# Patient Record
Sex: Male | Born: 1985 | Race: White | Hispanic: No | Marital: Single | Smoking: Current some day smoker
Health system: Southern US, Community
[De-identification: ages and names within clinical notes are randomized; demographics above are authoritative.]

## PROBLEM LIST (undated history)

## (undated) DIAGNOSIS — F329 Major depressive disorder, single episode, unspecified: Secondary | ICD-10-CM

## (undated) DIAGNOSIS — F1910 Other psychoactive substance abuse, uncomplicated: Secondary | ICD-10-CM

## (undated) DIAGNOSIS — F319 Bipolar disorder, unspecified: Secondary | ICD-10-CM

## (undated) DIAGNOSIS — F32A Depression, unspecified: Secondary | ICD-10-CM

## (undated) HISTORY — PX: ANKLE SURGERY: SHX546

## (undated) HISTORY — PX: HAND SURGERY: SHX662

---
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
## 2008-07-15 ENCOUNTER — Emergency Department (HOSPITAL_COMMUNITY): Admission: EM | Admit: 2008-07-15 | Discharge: 2008-07-15 | Payer: Self-pay | Admitting: Emergency Medicine

## 2008-07-17 ENCOUNTER — Ambulatory Visit: Payer: Self-pay | Admitting: Dentistry

## 2008-07-17 ENCOUNTER — Encounter: Admission: AD | Admit: 2008-07-17 | Discharge: 2008-07-17 | Payer: Self-pay | Admitting: Dentistry

## 2009-10-02 ENCOUNTER — Emergency Department (HOSPITAL_COMMUNITY): Admission: EM | Admit: 2009-10-02 | Discharge: 2009-10-02 | Payer: Self-pay | Admitting: Emergency Medicine

## 2009-11-05 ENCOUNTER — Emergency Department (HOSPITAL_COMMUNITY): Admission: EM | Admit: 2009-11-05 | Discharge: 2009-11-05 | Payer: Self-pay | Admitting: Emergency Medicine

## 2009-11-08 ENCOUNTER — Emergency Department (HOSPITAL_COMMUNITY): Admission: EM | Admit: 2009-11-08 | Discharge: 2009-11-08 | Payer: Self-pay | Admitting: Emergency Medicine

## 2010-01-08 ENCOUNTER — Emergency Department (HOSPITAL_COMMUNITY): Admission: EM | Admit: 2010-01-08 | Discharge: 2010-01-08 | Payer: Self-pay | Admitting: Emergency Medicine

## 2012-12-05 DIAGNOSIS — M79609 Pain in unspecified limb: Secondary | ICD-10-CM

## 2013-05-08 ENCOUNTER — Emergency Department (HOSPITAL_COMMUNITY)
Admission: EM | Admit: 2013-05-08 | Discharge: 2013-05-09 | Disposition: A | Discharge status: Home / Self Care | Payer: Self-pay | Attending: Emergency Medicine | Admitting: Emergency Medicine

## 2013-05-08 ENCOUNTER — Emergency Department (HOSPITAL_COMMUNITY): Payer: Self-pay

## 2013-05-08 ENCOUNTER — Encounter (HOSPITAL_COMMUNITY): Payer: Self-pay | Admitting: Emergency Medicine

## 2013-05-08 DIAGNOSIS — R4182 Altered mental status, unspecified: Secondary | ICD-10-CM | POA: Insufficient documentation

## 2013-05-08 DIAGNOSIS — R51 Headache: Secondary | ICD-10-CM | POA: Insufficient documentation

## 2013-05-08 DIAGNOSIS — F1910 Other psychoactive substance abuse, uncomplicated: Secondary | ICD-10-CM | POA: Insufficient documentation

## 2013-05-08 DIAGNOSIS — F17200 Nicotine dependence, unspecified, uncomplicated: Secondary | ICD-10-CM | POA: Insufficient documentation

## 2013-05-08 HISTORY — DX: Other psychoactive substance abuse, uncomplicated: F19.10

## 2013-05-08 MED ORDER — LORAZEPAM 2 MG/ML IJ SOLN
INTRAMUSCULAR | Status: AC
  Administered 2013-05-08: 1 mg via INTRAVENOUS
  Filled 2013-05-08: qty 1

## 2013-05-08 MED ORDER — LORAZEPAM 2 MG/ML IJ SOLN
1.0000 mg | Freq: Once | INTRAMUSCULAR | Status: AC
  Administered 2013-05-08: 1 mg via INTRAVENOUS

## 2013-05-08 NOTE — ED Notes (Signed)
NBOME is a psychedelic drug that is similar to LSD according to the patient, the patient snorted the powder, unknown amount, pt is paranoid, fearful of dying.

## 2013-05-08 NOTE — ED Notes (Signed)
Patient states he snorted an N-bomb an hour ago and now c/o head and face pressure.  Patient states he has a history of substance abuse and was taking drug to get high.

## 2013-05-08 NOTE — ED Provider Notes (Signed)
History  This chart was scribed for [employer]. Rubin Payor, MD by Bennett Scrape, ED Scribe. This patient was seen in room APA07/APA07 and the patient's care was started at 9:52 PM.  CSN: [id number]  Arrival date & time 05/08/13  2121   First MD Initiated Contact with Patient 05/08/13 2152      Chief Complaint  Patient presents with  . Headache   Level 5 Caveat-AMS due to illegal substance  The history is provided by the patient. No language interpreter was used.    HPI Comments: Daniel Rose is a 27 y.o. male who presents to the Emergency Department complaining of right-side HA that radiates to the left occipital area described as a "cramp" that started less than an hour after snorting an "N-bomb". He states that he is tripping currently but denies any hallucinations. He reports that he has snorted an "N-bomb" twice before and states that he "tripped on it a little". He is unsure of the dosage. Pt keeps repeating that he has a brain aneurysm and doesn't want to die. Due to his AMS, he is unable to answer any further questions.   Past Medical History  Diagnosis Date  . Substance abuse     Past Surgical History  Procedure Laterality Date  . Ankle surgery      No family history on file.  History  Substance Use Topics  . Smoking status: Current Some Day Smoker  . Smokeless tobacco: Not on file  . Alcohol Use: No      Review of Systems  Unable to perform ROS: Mental status change    Allergies  Review of patient's allergies indicates no known allergies.  Home Medications  No current outpatient prescriptions on file.  Triage Vitals: BP 182/92  Pulse 113  Temp(Src) 98.5 F (36.9 C) (Oral)  Resp 22  Ht 5\' 9"  (1.753 m)  Wt 175 lb (79.379 kg)  BMI 25.83 kg/m2  SpO2 97%  Physical Exam  Nursing note and vitals reviewed. Constitutional: He appears well-developed and well-nourished. No distress.  HENT:  Head: Normocephalic and atraumatic.  Mouth/Throat:  Oropharynx is clear and moist.  Eyes: Conjunctivae and EOM are normal.  Enlarged pupils bilaterally, minimally reactive  Neck: Neck supple. No tracheal deviation present.  Cardiovascular: Regular rhythm.   Mild tachycardia   Pulmonary/Chest: Effort normal and breath sounds normal. No respiratory distress.  Abdominal: Soft. There is no tenderness.  Musculoskeletal: Normal range of motion.  Neurological: He is alert.  Awake and alert but paranoid   Skin: Skin is warm and dry.  Psychiatric: He has a normal mood and affect. His behavior is normal.    ED Course  Procedures (including critical care time)  Medications  LORazepam (ATIVAN) injection 1 mg (1 mg Intravenous Given 05/08/13 2206)    DIAGNOSTIC STUDIES: Oxygen Saturation is 97% on room air, normal by my interpretation.    COORDINATION OF CARE: 9:55 PM-Discussed treatment plan which includes CT of head with pt at bedside and pt agreed to plan.   Ct Head Wo Contrast  05/08/2013   *RADIOLOGY REPORT*  Clinical Data: Headache.  CT HEAD WITHOUT CONTRAST  Technique:  Contiguous axial images were obtained from the base of the skull through the vertex without contrast.  Comparison: No priors.  Findings: No acute intracranial abnormalities.  Specifically, no evidence of acute intracranial hemorrhage, no definite findings of acute/subacute cerebral ischemia, no mass, mass effect, hydrocephalus or abnormal intra or extra-axial fluid collections. Visualized paranasal sinuses and mastoids are  well pneumatized.  No acute displaced skull fractures are identified.  IMPRESSION: 1.  No acute intracranial abnormalities. 2.  The appearance of the brain is normal.   Original Report Authenticated By: Trudie Reed, M.D.     1. Substance abuse   2. Headache       MDM  Patient with headache after snorting "N-bomb"negative head CT. Will monitor for side effects and likely discharge   I personally performed the services described in this  documentation, which was scribed in my presence. The recorded information has been reviewed and is accurate.        Juliet Rude. Rubin Payor, MD 05/08/13 2317

## 2013-05-09 NOTE — ED Provider Notes (Signed)
0001 Patient is walking around the ER department. Reviewed CT results with the patient. His brother is here and will be with him.Discharged home.  Nicoletta Dress. Colon Branch, MD 05/09/13 1610

## 2014-02-15 ENCOUNTER — Encounter (HOSPITAL_COMMUNITY): Payer: Self-pay | Admitting: Emergency Medicine

## 2014-02-15 ENCOUNTER — Emergency Department (HOSPITAL_COMMUNITY): Payer: Self-pay

## 2014-02-15 ENCOUNTER — Emergency Department (HOSPITAL_COMMUNITY)
Admission: EM | Admit: 2014-02-15 | Discharge: 2014-02-15 | Disposition: A | Discharge status: Home / Self Care | Payer: Self-pay | Attending: Emergency Medicine | Admitting: Emergency Medicine

## 2014-02-15 DIAGNOSIS — S60011A Contusion of right thumb without damage to nail, initial encounter: Secondary | ICD-10-CM

## 2014-02-15 DIAGNOSIS — S6000XA Contusion of unspecified finger without damage to nail, initial encounter: Secondary | ICD-10-CM | POA: Insufficient documentation

## 2014-02-15 DIAGNOSIS — Z79899 Other long term (current) drug therapy: Secondary | ICD-10-CM | POA: Insufficient documentation

## 2014-02-15 DIAGNOSIS — F17200 Nicotine dependence, unspecified, uncomplicated: Secondary | ICD-10-CM | POA: Insufficient documentation

## 2014-02-15 DIAGNOSIS — Y9389 Activity, other specified: Secondary | ICD-10-CM | POA: Insufficient documentation

## 2014-02-15 DIAGNOSIS — S93409A Sprain of unspecified ligament of unspecified ankle, initial encounter: Secondary | ICD-10-CM | POA: Insufficient documentation

## 2014-02-15 DIAGNOSIS — R296 Repeated falls: Secondary | ICD-10-CM | POA: Insufficient documentation

## 2014-02-15 DIAGNOSIS — Y929 Unspecified place or not applicable: Secondary | ICD-10-CM | POA: Insufficient documentation

## 2014-02-15 MED ORDER — HYDROCODONE-ACETAMINOPHEN 5-325 MG PO TABS: ORAL_TABLET | ORAL | Status: DC

## 2014-02-15 MED ORDER — IBUPROFEN 800 MG PO TABS: 800.0000 mg | ORAL_TABLET | Freq: Three times a day (TID) | ORAL | Status: DC

## 2014-02-15 MED ORDER — HYDROCODONE-ACETAMINOPHEN 5-325 MG PO TABS
1.0000 | ORAL_TABLET | Freq: Once | ORAL | Status: AC
  Administered 2014-02-15: 1 via ORAL
  Filled 2014-02-15: qty 1

## 2014-02-15 NOTE — ED Notes (Signed)
Pt states he fell Wednesday due to boots that he was wearing. Swelling to right lateral ankle with bruising and swelling to right hand also. Pt states he put his hand down to break his fall.

## 2014-02-15 NOTE — Discharge Instructions (Signed)
Ankle Sprain An ankle sprain is an injury to the strong, fibrous tissues (ligaments) that hold your ankle bones together.  HOME CARE   Put ice on your ankle for 1 2 days or as told by your doctor.  Put ice in a plastic bag.  Place a towel between your skin and the bag.  Leave the ice on for 15-20 minutes at a time, every 2 hours while you are awake.  Only take medicine as told by your doctor.  Raise (elevate) your injured ankle above the level of your heart as much as possible for 2 3 days.  Use crutches if your doctor tells you to. Slowly put your own weight on the affected ankle. Use the crutches until you can walk without pain.  If you have a plaster splint:  Do not rest it on anything harder than a pillow for 24 hours.  Do not put weight on it.  Do not get it wet.  Take it off to shower or bathe.  If given, use an elastic wrap or support stocking for support. Take the wrap off if your toes lose feeling (numb), tingle, or turn cold or blue.  If you have an air splint:  Add or let out air to make it comfortable.  Take it off at night and to shower and bathe.  Wiggle your toes and move your ankle up and down often while you are wearing it. GET HELP RIGHT AWAY IF:   Your toes lose feeling (numb) or turn blue.  You have severe pain that is increasing.  You have rapidly increasing bruising or puffiness (swelling).  Your toes feel very cold.  You lose feeling in your foot.  Your medicine does not help your pain. MAKE SURE YOU:   Understand these instructions.  Will watch your condition.  Will get help right away if you are not doing well or get worse. Document Released: 04/26/2008 Document Revised: 08/02/2012 Document Reviewed: 05/22/2012 [facility]ExitCare Patient Information 2014 [place]ExitCare, [place].  Joint Sprain A sprain is a tear or stretch in the ligaments that hold a joint together. Severe sprains may need as long as 3-6 weeks of immobilization and/or exercises to  heal completely. Sprained joints should be rested and protected. If not, they can become unstable and prone to re-injury. Proper treatment can reduce your pain, shorten the period of disability, and reduce the risk of repeated injuries. TREATMENT   Rest and elevate the injured joint to reduce pain and swelling.  Apply ice packs to the injury for 20-30 minutes every 2-3 hours for the next 2-3 days.  Keep the injury wrapped in a compression bandage or splint as long as the joint is painful or as instructed by your caregiver.  Do not use the injured joint until it is completely healed to prevent re-injury and chronic instability. Follow the instructions of your caregiver.  Long-term sprain management may require exercises and/or treatment by a physical therapist. Taping or special braces may help stabilize the joint until it is completely better. SEEK MEDICAL CARE IF:   You develop increased pain or swelling of the joint.  You develop increasing redness and warmth of the joint.  You develop a fever.  It becomes stiff.  Your hand or foot gets cold or numb. Document Released: 12/16/2004 Document Revised: 01/31/2012 Document Reviewed: 11/25/2008 [facility]ExitCare Patient Information 2014 [place]ExitCare, [place]LLC.

## 2014-02-17 NOTE — ED Provider Notes (Signed)
CSN: [id number]     Arrival date & time 02/15/14  1222 History   First MD Initiated Contact with Patient 02/15/14 1254     Chief Complaint  Patient presents with  . Fall     (Consider location/radiation/quality/duration/timing/severity/associated sxs/prior Treatment) Patient is a [age] y.o. male presenting with ankle pain. The history is provided by the patient.  Ankle Pain Location:  Ankle Time since incident:  2 days Injury: yes   Mechanism of injury: fall   Mechanism of injury comment:  Twisting injury Fall:    Fall occurred:  Tripped and standing   Impact surface:  Hard floor   Point of impact:  Outstretched arms   Entrapped after fall: no   Ankle location:  R ankle Pain details:    Quality:  Aching and throbbing   Radiates to:  Does not radiate   Severity:  Moderate   Onset quality:  Sudden   Duration:  2 days   Timing:  Constant   Progression:  Unchanged Chronicity:  New Dislocation: no   Prior injury to area:  No Relieved by:  Nothing Worsened by:  Bearing weight, activity and rotation Ineffective treatments:  NSAIDs Associated symptoms: swelling   Associated symptoms: no back pain, no decreased ROM, no fever, no neck pain, no numbness and no tingling    Patient also c/o bruising to the right thumb with swelling, but denies pain to the thumb or decreased range of motion.     Past Medical History  Diagnosis Date  . Substance abuse    Past Surgical History  Procedure Laterality Date  . Ankle surgery     No family history on file. History  Substance Use Topics  . Smoking status: Current Some Day Smoker    Types: Cigarettes  . Smokeless tobacco: Not on file  . Alcohol Use: No    Review of Systems  Constitutional: Negative for fever and chills.  Genitourinary: Negative for dysuria and difficulty urinating.  Musculoskeletal: Positive for arthralgias and joint swelling. Negative for back pain and neck pain.  Skin: Negative for color change and wound.  All  other systems reviewed and are negative.      Allergies  Review of patient's allergies indicates no known allergies.  Home Medications   Current Outpatient Rx  Name  Route  Sig  Dispense  Refill  . aspirin EC 81 MG tablet   Oral   Take 81-243 mg by mouth daily as needed for moderate pain.         Marland Kitchen. HYDROcodone-acetaminophen (NORCO/VICODIN) 5-325 MG per tablet      Take one-two tabs po q 4-6 hrs prn pain   20 tablet   0   . ibuprofen (ADVIL,MOTRIN) 800 MG tablet   Oral   Take 1 tablet (800 mg total) by mouth 3 (three) times daily.   21 tablet   0    BP 136/76  Pulse 89  Temp(Src) 98.8 F (37.1 C) (Oral)  Resp 16  Ht 5\' 8"  (1.727 m)  Wt 180 lb (81.647 kg)  BMI 27.38 kg/m2  SpO2 99% Physical Exam  Nursing note and vitals reviewed. Constitutional: He is oriented to person, place, and time. He appears well-developed and well-nourished. No distress.  HENT:  Head: Normocephalic and atraumatic.  Cardiovascular: Normal rate, regular rhythm, normal heart sounds and intact distal pulses.   No murmur heard. Pulmonary/Chest: Effort normal and breath sounds normal. No respiratory distress.  Musculoskeletal: He exhibits edema and tenderness.  Right ankle: He exhibits decreased range of motion, swelling and ecchymosis. He exhibits no deformity, no laceration and normal pulse. Tenderness. Lateral malleolus and medial malleolus tenderness found. No posterior TFL, no head of 5th metatarsal and no proximal fibula tenderness found. Achilles tendon normal.       Right hand: He exhibits tenderness and swelling. He exhibits normal range of motion, no bony tenderness, normal two-point discrimination, normal capillary refill, no deformity and no laceration. Normal sensation noted. Normal strength noted. He exhibits no finger abduction and no thumb/finger opposition.       Hands: ttp of the lateral and medial right ankle, moderate STS.    DP pulse is brisk,distal sensation intact.   Mild  To moderate bruising.  No erythema, abrasion, or bony deformity.  No proximal tenderness. Compartments soft  Neurological: He is alert and oriented to person, place, and time. He exhibits normal muscle tone. Coordination normal.  Skin: Skin is warm and dry.    ED Course  Procedures (including critical care time) Labs Review Labs Reviewed - No data to display Imaging Review Dg Ankle Complete Right  02/15/2014   CLINICAL DATA:  FALL  EXAM: RIGHT ANKLE - COMPLETE 3+ VIEW  COMPARISON:  DG ANKLE COMPLETE*R* dated 01/08/2010  FINDINGS: There is no evidence of fracture, dislocation, or joint effusion. There is a wall corticated ossific fragment distal to the medial malleolus likely representing sequela of prior trauma. There is an old fracture of the dorsal proximal aspect of the navicular. There is no evidence of arthropathy or other focal bone abnormality. Severe soft tissue swelling over the lateral malleolus.  IMPRESSION: Severe soft tissue swelling over the lateral malleolus without a discrete tear.   Electronically Signed   By: Elige Ko   On: 02/15/2014 13:43   Dg Hand Complete Right  02/15/2014   CLINICAL DATA:  Pain and bruising at the base of right thumb  EXAM: RIGHT HAND - COMPLETE 3+ VIEW  COMPARISON:  None.  FINDINGS: Three views of the right hand submitted. No acute fracture or subluxation. No radiopaque foreign body.  IMPRESSION: Negative.   Electronically Signed   By: Natasha Mead M.D.   On: 02/15/2014 13:42    EKG Interpretation None      MDM   Final diagnoses:  Ankle sprain  Contusion of thumb, right    ASO splint applied, thumb spica also applied.  Crutches given.  Pain improved, remains NV intact.  No concerning sx's for compartment syndrome.  No tenderness proximal to the ankle.  Bruising and STS of the proximal thumb.  Sx's likely related to sprain of ankle and thumb.    Pt agrees to RICE therapy and close f/u with orthopedics.  Pt appears stable for  d/c    Britley Gashi L. Trisha Mangle, PA-C 02/17/14 2138

## 2014-02-19 NOTE — ED Provider Notes (Signed)
Medical screening examination/treatment/procedure(s) were performed by non-physician practitioner and as supervising physician I was immediately available for consultation/collaboration.   EKG Interpretation None        Astin Sayre M Frannie Shedrick, DO 02/19/14 1040 

## 2015-02-12 ENCOUNTER — Emergency Department (HOSPITAL_COMMUNITY)
Admission: EM | Admit: 2015-02-12 | Discharge: 2015-02-12 | Disposition: A | Discharge status: Home / Self Care | Payer: Self-pay | Attending: Emergency Medicine | Admitting: Emergency Medicine

## 2015-02-12 ENCOUNTER — Encounter (HOSPITAL_COMMUNITY): Payer: Self-pay | Admitting: *Deleted

## 2015-02-12 DIAGNOSIS — Z7982 Long term (current) use of aspirin: Secondary | ICD-10-CM | POA: Insufficient documentation

## 2015-02-12 DIAGNOSIS — J069 Acute upper respiratory infection, unspecified: Secondary | ICD-10-CM | POA: Insufficient documentation

## 2015-02-12 DIAGNOSIS — Z720 Tobacco use: Secondary | ICD-10-CM | POA: Insufficient documentation

## 2015-02-12 DIAGNOSIS — Z79899 Other long term (current) drug therapy: Secondary | ICD-10-CM | POA: Insufficient documentation

## 2015-02-12 DIAGNOSIS — Z791 Long term (current) use of non-steroidal anti-inflammatories (NSAID): Secondary | ICD-10-CM | POA: Insufficient documentation

## 2015-02-12 NOTE — Discharge Instructions (Signed)
Upper Respiratory Infection, Adult An upper respiratory infection (URI) is also sometimes known as the common cold. The upper respiratory tract includes the nose, sinuses, throat, trachea, and bronchi. Bronchi are the airways leading to the lungs. Most people improve within 1 week, but symptoms can last up to 2 weeks. A residual cough may last even longer.  CAUSES Many different viruses can infect the tissues lining the upper respiratory tract. The tissues become irritated and inflamed and often become very moist. Mucus production is also common. A cold is contagious. You can easily spread the virus to others by oral contact. This includes kissing, sharing a glass, coughing, or sneezing. Touching your mouth or nose and then touching a surface, which is then touched by another person, can also spread the virus. SYMPTOMS  Symptoms typically develop 1 to 3 days after you come in contact with a cold virus. Symptoms vary from person to person. They may include:  Runny nose.  Sneezing.  Nasal congestion.  Sinus irritation.  Sore throat.  Loss of voice (laryngitis).  Cough.  Fatigue.  Muscle aches.  Loss of appetite.  Headache.  Low-grade fever. DIAGNOSIS  You might diagnose your own cold based on familiar symptoms, since most people get a cold 2 to 3 times a year. Your caregiver can confirm this based on your exam. Most importantly, your caregiver can check that your symptoms are not due to another disease such as strep throat, sinusitis, pneumonia, asthma, or epiglottitis. Blood tests, throat tests, and X-rays are not necessary to diagnose a common cold, but they may sometimes be helpful in excluding other more serious diseases. Your caregiver will decide if any further tests are required. RISKS AND COMPLICATIONS  You may be at risk for a more severe case of the common cold if you smoke cigarettes, have chronic heart disease (such as heart failure) or lung disease (such as asthma), or if  you have a weakened immune system. The very young and very old are also at risk for more serious infections. Bacterial sinusitis, middle ear infections, and bacterial pneumonia can complicate the common cold. The common cold can worsen asthma and chronic obstructive pulmonary disease (COPD). Sometimes, these complications can require emergency medical care and may be life-threatening. PREVENTION  The best way to protect against getting a cold is to practice good hygiene. Avoid oral or hand contact with people with cold symptoms. Wash your hands often if contact occurs. There is no clear evidence that vitamin C, vitamin E, echinacea, or exercise reduces the chance of developing a cold. However, it is always recommended to get plenty of rest and practice good nutrition. TREATMENT  Treatment is directed at relieving symptoms. There is no cure. Antibiotics are not effective, because the infection is caused by a virus, not by bacteria. Treatment may include:  Increased fluid intake. Sports drinks offer valuable electrolytes, sugars, and fluids.  Breathing heated mist or steam (vaporizer or shower).  Eating chicken soup or other clear broths, and maintaining good nutrition.  Getting plenty of rest.  Using gargles or lozenges for comfort.  Controlling fevers with ibuprofen or acetaminophen as directed by your caregiver.  Increasing usage of your inhaler if you have asthma. Zinc gel and zinc lozenges, taken in the first 24 hours of the common cold, can shorten the duration and lessen the severity of symptoms. Pain medicines may help with fever, muscle aches, and throat pain. A variety of non-prescription medicines are available to treat congestion and runny nose. Your caregiver   can make recommendations and may suggest nasal or lung inhalers for other symptoms.  HOME CARE INSTRUCTIONS   Only take over-the-counter or prescription medicines for pain, discomfort, or fever as directed by your  caregiver.  Use a warm mist humidifier or inhale steam from a shower to increase air moisture. This may keep secretions moist and make it easier to breathe.  Drink enough water and fluids to keep your urine clear or pale yellow.  Rest as needed.  Return to work when your temperature has returned to normal or as your caregiver advises. You may need to stay home longer to avoid infecting others. You can also use a face mask and careful hand washing to prevent spread of the virus. SEEK MEDICAL CARE IF:   After the first few days, you feel you are getting worse rather than better.  You need your caregiver's advice about medicines to control symptoms.  You develop chills, worsening shortness of breath, or brown or red sputum. These may be signs of pneumonia.  You develop yellow or brown nasal discharge or pain in the face, especially when you bend forward. These may be signs of sinusitis.  You develop a fever, swollen neck glands, pain with swallowing, or white areas in the back of your throat. These may be signs of strep throat. SEEK IMMEDIATE MEDICAL CARE IF:   You have a fever.  You develop severe or persistent headache, ear pain, sinus pain, or chest pain.  You develop wheezing, a prolonged cough, cough up blood, or have a change in your usual mucus (if you have chronic lung disease).  You develop sore muscles or a stiff neck. Document Released: 05/04/2001 Document Revised: 01/31/2012 Document Reviewed: 02/13/2014 Exit[employer] Patient Information 2015 [employer]. This information is not intended to replace advice given to you by your health care provider. Make sure you discuss any questions you have with your health care provider.  

## 2015-02-12 NOTE — ED Notes (Signed)
Pt began feeling bad yesterday morning c/o sore thorat, runny nose and sweating. Pt states he is feeling a little better today but states he needs a doctors note to return to work.

## 2015-02-12 NOTE — ED Provider Notes (Signed)
CSN: [id number]     Arrival date & time 02/12/15  1452 History   First MD Initiated Contact with Patient 02/12/15 1506     Chief Complaint  Patient presents with  . Nasal Congestion     (Consider location/radiation/quality/duration/timing/severity/associated sxs/prior Treatment) The history is provided by the patient.   Daniel Rose is a 29 y.o. male with a one day history of clear eye drainage, sore throat, clear nasal discharge and several episodes of diaphoresis yesterday which has since improved, yet still has mild nasal drainage without cough, shortness of breath, ear pain or headache, weakness, chest pain presenting for a work note.  He tried to return to work today but was told he needed a work note to return.  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  He has had no medicines for his symptoms.     Past Medical History  Diagnosis Date  . Substance abuse    Past Surgical History  Procedure Laterality Date  . Ankle surgery     No family history on file. History  Substance Use Topics  . Smoking status: Current Some Day Smoker    Types: Cigarettes  . Smokeless tobacco: Not on file  . Alcohol Use: No    Review of Systems  Constitutional: Negative for fever and chills.  HENT: Positive for rhinorrhea and sore throat. Negative for congestion, ear pain, sinus pressure, trouble swallowing and voice change.   Eyes: Negative for discharge and itching.  Respiratory: Negative for cough, shortness of breath, wheezing and stridor.   Cardiovascular: Negative for chest pain.  Gastrointestinal: Negative for abdominal pain.  Genitourinary: Negative.       Allergies  Review of patient's allergies indicates no known allergies.  Home Medications   Prior to Admission medications   Medication Sig Start Date End Date Taking? Authorizing Provider  aspirin EC 81 MG tablet Take 81-243 mg by mouth daily as needed for moderate pain.    Historical Provider, MD   HYDROcodone-acetaminophen (NORCO/VICODIN) 5-325 MG per tablet Take one-two tabs po q 4-6 hrs prn pain 02/15/14   Tammi Triplett, PA-C  ibuprofen (ADVIL,MOTRIN) 800 MG tablet Take 1 tablet (800 mg total) by mouth 3 (three) times daily. 02/15/14   Tammi Triplett, PA-C   BP 141/77 mmHg  Pulse 72  Temp(Src) 98.6 F (37 C) (Oral)  Resp 16  Ht  (1.727 m)  Wt 180 lb (81.647 kg)  BMI 27.38 kg/m2  SpO2 100% Physical Exam  Constitutional: He is oriented to person, place, and time. He appears well-developed and well-nourished.  HENT:  Head: Normocephalic and atraumatic.  Right Ear: Tympanic membrane, external ear and ear canal normal.  Left Ear: Tympanic membrane, external ear and ear canal normal.  Nose: Rhinorrhea present. No mucosal edema.  Mouth/Throat: Uvula is midline, oropharynx is clear and moist and mucous membranes are normal. No oropharyngeal exudate, posterior oropharyngeal edema, posterior oropharyngeal erythema or tonsillar abscesses.  Eyes: Conjunctivae are normal.  Cardiovascular: Normal rate and normal heart sounds.   Pulmonary/Chest: Effort normal. No respiratory distress. He has no wheezes. He has no rales.  Musculoskeletal: Normal range of motion.  Neurological: He is alert and oriented to person, place, and time.  Skin: Skin is warm and dry. No rash noted.  Psychiatric: He has a normal mood and affect.    ED Course  Procedures (including critical care time) Labs Review Labs Reviewed - No data to display  Imaging Review No results found.   EKG Interpretation  None      MDM   Final diagnoses:  URI (upper respiratory infection)    Pt exam benign with no significant findings, suspect mild uri.  Work note given for return to work. Prn f/u anticipated.    Burgess AmorJulie Dawit Tankard, PA-C 02/12/15 1729  Dione Boozeavid Glick, MD 02/13/15 [phone]

## 2015-11-18 ENCOUNTER — Encounter (HOSPITAL_COMMUNITY): Payer: Self-pay | Admitting: *Deleted

## 2015-11-18 ENCOUNTER — Emergency Department (HOSPITAL_COMMUNITY)
Admission: EM | Admit: 2015-11-18 | Discharge: 2015-11-18 | Disposition: A | Discharge status: Home / Self Care | Payer: Self-pay | Attending: Emergency Medicine | Admitting: Emergency Medicine

## 2015-11-18 DIAGNOSIS — B86 Scabies: Secondary | ICD-10-CM | POA: Insufficient documentation

## 2015-11-18 DIAGNOSIS — Z7982 Long term (current) use of aspirin: Secondary | ICD-10-CM | POA: Insufficient documentation

## 2015-11-18 DIAGNOSIS — Z791 Long term (current) use of non-steroidal anti-inflammatories (NSAID): Secondary | ICD-10-CM | POA: Insufficient documentation

## 2015-11-18 DIAGNOSIS — F17200 Nicotine dependence, unspecified, uncomplicated: Secondary | ICD-10-CM | POA: Insufficient documentation

## 2015-11-18 MED ORDER — PERMETHRIN 5 % EX CREA: TOPICAL_CREAM | CUTANEOUS | Status: DC

## 2015-11-18 MED ORDER — ACETAMINOPHEN-CODEINE #3 300-30 MG PO TABS: 1.0000 | ORAL_TABLET | Freq: Four times a day (QID) | ORAL | Status: DC | PRN

## 2015-11-18 NOTE — ED Provider Notes (Signed)
CSN: [id number]     Arrival date & time 11/18/15  1522 History   First MD Initiated Contact with Patient 11/18/15 1601     Chief Complaint  Patient presents with  . Rash     (Consider location/radiation/quality/duration/timing/severity/associated sxs/prior Treatment) The history is provided by the patient.   Daniel Rose is a 29 y.o. male presenting with a painful and itchy rash which started 2 weeks ago.  He stayed on a friends couch overnight and since then has had raised bumps which have progressed from around his waistline, his ankle, wrist, in between his fingers and along his neck.  He has had no further contact with the source but continues to have progression of rash since back in his own home.  The lesions are painful now that he has scratched so much.  He denies fevers or chills. No other family members with similar symptoms.   He has found no alleviators.    Past Medical History  Diagnosis Date  . Substance abuse    Past Surgical History  Procedure Laterality Date  . Ankle surgery     History reviewed. No pertinent family history. Social History  Substance Use Topics  . Smoking status: Current Some Day Smoker    Types: Cigarettes  . Smokeless tobacco: None  . Alcohol Use: No    Review of Systems  Constitutional: Negative for fever and chills.  Respiratory: Negative for shortness of breath and wheezing.   Skin: Positive for rash.  Neurological: Negative for numbness.      Allergies  Review of patient's allergies indicates no known allergies.  Home Medications   Prior to Admission medications   Medication Sig Start Date End Date Taking? Authorizing Provider  acetaminophen-codeine (TYLENOL #3) 300-30 MG tablet Take 1-2 tablets by mouth every 6 (six) hours as needed for moderate pain. 11/18/15   Burgess AmorJulie Zori Benbrook, PA-C  aspirin EC 81 MG tablet Take 81-243 mg by mouth daily as needed for moderate pain.    Historical Provider, MD  HYDROcodone-acetaminophen  (NORCO/VICODIN) 5-325 MG per tablet Take one-two tabs po q 4-6 hrs prn pain 02/15/14   Tammy Triplett, PA-C  ibuprofen (ADVIL,MOTRIN) 800 MG tablet Take 1 tablet (800 mg total) by mouth 3 (three) times daily. 02/15/14   Tammy Triplett, PA-C  permethrin (ELIMITE) 5 % cream Apply as instructed, leaving on for 10 hours, then bathing.  Repeat in 10 days if rash persists 11/18/15   Burgess AmorJulie Taliesin Hartlage, PA-C   BP 117/73 mmHg  Pulse 98  Temp(Src) 99.4 F (37.4 C) (Temporal)  Resp 14  Ht 5\' 8"  (1.727 m)  Wt 84.369 kg  BMI 28.29 kg/m2  SpO2 98% Physical Exam  Constitutional: He appears well-developed and well-nourished. No distress.  HENT:  Head: Normocephalic.  Neck: Neck supple.  Cardiovascular: Normal rate.   Pulmonary/Chest: Effort normal. He has no wheezes.  Musculoskeletal: Normal range of motion. He exhibits no edema.  Skin: Rash noted. Rash is papular. Rash is not vesicular. There is erythema.  Rash localized to fingerweb spaces, wrist and volar forearms, lateral neck, along waistline.  Older appearing lesions on ankles (scabbed over from scratching), excoriations present.  No drainage or red streaking.    ED Course  Procedures (including critical care time) Labs Review Labs Reviewed - No data to display  Imaging Review No results found. I have personally reviewed and evaluated these images and lab results as part of my medical decision-making.   EKG Interpretation None  MDM   Final diagnoses:  Scabies    Permethrin, 1 refill prn 10 days.  Referral to dermatology if sx persist despite tx.    Burgess Amor, PA-C 11/18/15 1624  Rolland Porter, MD 11/29/15 [phone]

## 2015-11-18 NOTE — ED Notes (Signed)
Pt c/o rash to areas around belt line and sock line. Started about 2 weeks ago.

## 2015-11-18 NOTE — Discharge Instructions (Signed)

## 2015-12-16 ENCOUNTER — Emergency Department (HOSPITAL_COMMUNITY)
Admission: EM | Admit: 2015-12-16 | Discharge: 2015-12-16 | Disposition: A | Discharge status: Home / Self Care | Payer: Self-pay | Attending: Emergency Medicine | Admitting: Emergency Medicine

## 2015-12-16 ENCOUNTER — Encounter (HOSPITAL_COMMUNITY): Payer: Self-pay | Admitting: Emergency Medicine

## 2015-12-16 DIAGNOSIS — Z791 Long term (current) use of non-steroidal anti-inflammatories (NSAID): Secondary | ICD-10-CM | POA: Insufficient documentation

## 2015-12-16 DIAGNOSIS — R2231 Localized swelling, mass and lump, right upper limb: Secondary | ICD-10-CM | POA: Insufficient documentation

## 2015-12-16 DIAGNOSIS — M7989 Other specified soft tissue disorders: Secondary | ICD-10-CM

## 2015-12-16 DIAGNOSIS — Z7982 Long term (current) use of aspirin: Secondary | ICD-10-CM | POA: Insufficient documentation

## 2015-12-16 DIAGNOSIS — F17210 Nicotine dependence, cigarettes, uncomplicated: Secondary | ICD-10-CM | POA: Insufficient documentation

## 2015-12-16 DIAGNOSIS — Z9889 Other specified postprocedural states: Secondary | ICD-10-CM | POA: Insufficient documentation

## 2015-12-16 MED ORDER — NAPROXEN 500 MG PO TABS: ORAL_TABLET | ORAL | Status: DC

## 2015-12-16 MED ORDER — KETOROLAC TROMETHAMINE 60 MG/2ML IM SOLN
60.0000 mg | Freq: Once | INTRAMUSCULAR | Status: AC
  Administered 2015-12-16: 60 mg via INTRAMUSCULAR
  Filled 2015-12-16: qty 2

## 2015-12-16 NOTE — ED Provider Notes (Signed)
CSN: [id number]     Arrival date & time 12/16/15  0304 History   First MD Initiated Contact with Patient 12/16/15 0345    Chief Complaint  Patient presents with  . Hand Pain     (Consider location/radiation/quality/duration/timing/severity/associated sxs/prior Treatment) HPI patient states he had surgery on his right hand about 5 or 6 years ago. He does not know what happened. He does not know the name of the doctor that did the surgery. He states it was done in [place]. He states he just started a new job and he just got done finishing his second 12 hour night shift yesterday morning. He states during the day he started having swelling and pain in his right hand. He states he has some numbness on the palm of his hand involving the middle and index and base of his thumb. 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 He states because of the swelling he is having trouble gripping things and it hurts when he tries to grip. He states he has not had this before. He denies any known latex allergy. He denies any itching.   PCP none  Past Medical History  Diagnosis Date  . Substance abuse    Past Surgical History  Procedure Laterality Date  . Ankle surgery    . Hand surgery     History reviewed. No pertinent family history. Social History  Substance Use Topics  . Smoking status: Current Some Day Smoker -- 0.50 packs/day    Types: Cigarettes  . Smokeless tobacco: None  . Alcohol Use: Yes     Comment: occassionally  employed  Review of Systems  All other systems reviewed and are negative.     Allergies  Review of patient's allergies indicates no known allergies.  Home Medications   Prior to Admission medications   Medication Sig Start Date End Date Taking? Authorizing Provider  acetaminophen-codeine (TYLENOL #3) 300-30 MG tablet Take 1-2 tablets by mouth every 6 (six)  hours as needed for moderate pain. 11/18/15   Burgess Amor, PA-C  aspirin EC 81 MG tablet Take 81-243 mg by mouth daily as needed for moderate pain.    Historical Provider, MD  HYDROcodone-acetaminophen (NORCO/VICODIN) 5-325 MG per tablet Take one-two tabs po q 4-6 hrs prn pain 02/15/14   Tammy Triplett, PA-C  ibuprofen (ADVIL,MOTRIN) 800 MG tablet Take 1 tablet (800 mg total) by mouth 3 (three) times daily. 02/15/14   Tammy Triplett, PA-C  naproxen (NAPROSYN) 500 MG tablet Take 1 po BID with food prn pain 12/16/15   Devoria Albe, MD  permethrin (ELIMITE) 5 % cream Apply as instructed, leaving on for 10 hours, then bathing.  Repeat in 10 days if rash persists 11/18/15   Burgess Amor, PA-C   BP 141/89 mmHg  Pulse 57  Temp(Src) 98.1 F (36.7 C) (Oral)  Resp 18  Ht  (1.753 m)  Wt 175 lb (79.379 kg)  BMI 25.83 kg/m2  SpO2 100% Physical Exam  Constitutional: He is oriented to person, place, and time. He appears well-developed and well-nourished.  Non-toxic appearance. He does not appear ill. No distress.  HENT:  Head: Normocephalic and atraumatic.  Right Ear: External ear normal.  Left Ear: External ear normal.  Nose: Nose normal. No mucosal edema or rhinorrhea.  Mouth/Throat: Oropharynx is clear and moist and mucous membranes are normal. No dental abscesses  or uvula swelling.  Eyes: Conjunctivae and EOM are normal.  Neck: Normal range of motion and full passive range of motion without pain.  Pulmonary/Chest: Effort normal and breath sounds normal. He has no rhonchi. He exhibits no crepitus.  Abdominal: Normal appearance.  Musculoskeletal: Normal range of motion. He exhibits edema and tenderness.  Moves all extremities well. Patient is noted to have diffuse swelling of his right hand. There is no redness or warmth to suggest cellulitis. He does have difficulty flexing his fingers due to the swelling. He appears to have some angioedema of the palm of his hand and fingers. He is noted to have a old  scar over the proximal MCP joint of his index finger where he states he had surgery.  Neurological: He is alert and oriented to person, place, and time. He has normal strength. No cranial nerve deficit.  Skin: Skin is warm, dry and intact. No rash noted. No erythema. No pallor.  Psychiatric: He has a normal mood and affect. His speech is normal and behavior is normal. His mood appears not anxious.  Nursing note and vitals reviewed.   ED Course  Procedures (including critical care time)  Medications  ketorolac (TORADOL) injection 60 mg (60 mg Intramuscular Given 12/16/15 0413)    Patient drove himself to the ED. He states he just wants an anti-inflammatory.  I reviewed patient's prior x-rays, and 2009 he had a dislocation at the MCP joint of the index finger. He also had a x-ray done in March 2015 which was normal, there are no screws or pins and his right hand.  I feel either has a latex allergy from the glove he is using at work or he is just having swelling due to constant pressure on his hand from using the knife constantly for 12 hours. He was placed on anti-inflammatory, we discussed using a different type of glove at work, and follow up with orthopedics if his symptoms are not improving.  Review of the [place] shows he got one prescription for Tylenol # 3 filled on January 4 for 12 pills. These were prescribed from our emergency department    MDM   Final diagnoses:  Swelling of right hand    Discharge Medication List as of 12/16/2015  4:16 AM    START taking these medications   Details  naproxen (NAPROSYN) 500 MG tablet Take 1 po BID with food prn pain, Print        Plan discharge  Devoria Albe, MD, Concha Pyo, MD 12/16/15 [phone]

## 2015-12-16 NOTE — ED Notes (Signed)
Pt states understanding of care given and follow up instructions 

## 2015-12-16 NOTE — ED Notes (Signed)
Pt states that he had surgery on his hand 5 years ago,  Just started new job, having pain in right hand now

## 2015-12-16 NOTE — Discharge Instructions (Signed)
Elevate your hand. Use ice packs to help get the swelling down. Try using a different type of glove at work. Follow up with Dr Teena Dunk, an orthopedist if it isn't improving. Call his office to get an appointment.  Return to the ED if your swelling is getting worse, you get a fever, or it seems to worse in any way.

## 2016-07-24 ENCOUNTER — Encounter (HOSPITAL_COMMUNITY): Payer: Self-pay | Admitting: Emergency Medicine

## 2016-07-24 ENCOUNTER — Emergency Department (HOSPITAL_COMMUNITY)
Admission: EM | Admit: 2016-07-24 | Discharge: 2016-07-24 | Disposition: A | Discharge status: Home / Self Care | Payer: Self-pay | Attending: Emergency Medicine | Admitting: Emergency Medicine

## 2016-07-24 DIAGNOSIS — F17210 Nicotine dependence, cigarettes, uncomplicated: Secondary | ICD-10-CM | POA: Insufficient documentation

## 2016-07-24 DIAGNOSIS — B353 Tinea pedis: Secondary | ICD-10-CM | POA: Insufficient documentation

## 2016-07-24 DIAGNOSIS — Z7982 Long term (current) use of aspirin: Secondary | ICD-10-CM | POA: Insufficient documentation

## 2016-07-24 MED ORDER — DICLOFENAC SODIUM 75 MG PO TBEC: 75.0000 mg | DELAYED_RELEASE_TABLET | Freq: Two times a day (BID) | ORAL | 0 refills | Status: AC

## 2016-07-24 MED ORDER — CLOTRIMAZOLE 1 % EX CREA: TOPICAL_CREAM | CUTANEOUS | 0 refills | Status: AC

## 2016-07-24 NOTE — ED Triage Notes (Signed)
PT states he has reoccurring rash/abraison area to top of right foot and states pain worse after working today.

## 2016-07-24 NOTE — ED Provider Notes (Signed)
[facility]EMER[facility]ENCY DEPT Provider Note   CSN: [id number] Arrival date & time: 07/24/16  1818     History   Chief Complaint Chief Complaint  Patient presents with  . Rash    HPI Daniel Rose is a 30 y.o. male.  HPI   Daniel Rose is a 30 y.o. male who presents to the Emergency Department complaining of a recurrent rash to the top of his right foot.  He states the rash has been worse today after working.  He reports wearing rubber shoes and his feet stay wet frequently.  He reports mild itching and pain to his foot with palpation and weight bearing.  He denies swelling, numbness or red streaks.     Past Medical History:  Diagnosis Date  . Substance abuse     There are no active problems to display for this patient.   Past Surgical History:  Procedure Laterality Date  . ANKLE SURGERY    . HAND SURGERY         Home Medications    Prior to Admission medications   Medication Sig Start Date End Date Taking? Authorizing Provider  acetaminophen-codeine (TYLENOL #3) 300-30 MG tablet Take 1-2 tablets by mouth every 6 (six) hours as needed for moderate pain. 11/18/15   Burgess Amor, PA-C  aspirin EC 81 MG tablet Take 81-243 mg by mouth daily as needed for moderate pain.    Historical Provider, MD  HYDROcodone-acetaminophen (NORCO/VICODIN) 5-325 MG per tablet Take one-two tabs po q 4-6 hrs prn pain 02/15/14   Keamber Macfadden, PA-C  ibuprofen (ADVIL,MOTRIN) 800 MG tablet Take 1 tablet (800 mg total) by mouth 3 (three) times daily. 02/15/14   Vivian Okelley, PA-C  naproxen (NAPROSYN) 500 MG tablet Take 1 po BID with food prn pain 12/16/15   Devoria Albe, MD  permethrin (ELIMITE) 5 % cream Apply as instructed, leaving on for 10 hours, then bathing.  Repeat in 10 days if rash persists 11/18/15   Burgess Amor, PA-C    Family History History reviewed. No pertinent family history.  Social History Social History  Substance Use Topics  . Smoking status: Current Some Day Smoker   Packs/day: 0.50    Types: Cigarettes  . Smokeless tobacco: Never Used  . Alcohol use Yes     Comment: occassionally     Allergies   Review of patient's allergies indicates no known allergies.   Review of Systems Review of Systems  Constitutional: Negative for chills and fever.  Gastrointestinal: Negative for nausea and vomiting.  Musculoskeletal: Positive for myalgias (right foot pain). Negative for arthralgias and joint swelling.  Skin: Positive for rash (right foot). Negative for color change.       Abscess   Hematological: Negative for adenopathy.  All other systems reviewed and are negative.    Physical Exam Updated Vital Signs BP 129/94 (BP Location: Left Arm)   Pulse 85   Temp 98.1 F (36.7 C) (Oral)   Resp 18   Ht 5\' 9"  (1.753 m)   Wt 83.5 kg   SpO2 100%   BMI 27.17 kg/m   Physical Exam  Constitutional: He is oriented to person, place, and time. He appears well-developed and well-nourished. No distress.  HENT:  Head: Normocephalic and atraumatic.  Mouth/Throat: Oropharynx is clear and moist.  Cardiovascular: Normal rate, regular rhythm and intact distal pulses.   No murmur heard. Pulmonary/Chest: Effort normal and breath sounds normal. No respiratory distress.  Musculoskeletal: Normal range of motion. He exhibits no edema  or tenderness.  Neurological: He is alert and oriented to person, place, and time. He exhibits normal muscle tone. Coordination normal.  Skin: Skin is warm. No rash noted. There is erythema.  Localized area of excoriation and crusting to the dorsal right foot.  No edema or surrounding erythema.    Psychiatric: He has a normal mood and affect.  Nursing note and vitals reviewed.    ED Treatments / Results  Labs (all labs ordered are listed, but only abnormal results are displayed) Labs Reviewed - No data to display  EKG  EKG Interpretation None       Radiology No results found.  Procedures Procedures (including critical care  time)  Medications Ordered in ED Medications - No data to display   Initial Impression / Assessment and Plan / ED Course  I have reviewed the triage vital signs and the nursing notes.  Pertinent labs & imaging results that were available during my care of the patient were reviewed by me and considered in my medical decision making (see chart for details).  Clinical Course   Pt well appearing.  Localized area of excoriation to dorsal right foot.  Likely fungal.   Stable for d/c  Final Clinical Impressions(s) / ED Diagnoses   Final diagnoses:  Tinea pedis of right foot    New Prescriptions New Prescriptions   No medications on file     Rosey Bathammy Vitor Overbaugh, PA-C 07/26/16 2214    Jacalyn LefevreJulie Haviland, MD 07/26/16 2221

## 2016-07-24 NOTE — Discharge Instructions (Signed)
Apply cream as directed.  Also apply 1% hydrocortisone cream 3 times a day for 4-5 days.  Try to keep your feet dry.  Follow-up with the dermatologist listed if not improving

## 2016-08-30 ENCOUNTER — Encounter (HOSPITAL_COMMUNITY): Payer: Self-pay | Admitting: Emergency Medicine

## 2016-08-30 ENCOUNTER — Emergency Department (HOSPITAL_COMMUNITY): Payer: Self-pay

## 2016-08-30 ENCOUNTER — Emergency Department (HOSPITAL_COMMUNITY)
Admission: EM | Admit: 2016-08-30 | Discharge: 2016-08-30 | Disposition: A | Discharge status: Home / Self Care | Payer: Self-pay | Attending: Emergency Medicine | Admitting: Emergency Medicine

## 2016-08-30 DIAGNOSIS — F17210 Nicotine dependence, cigarettes, uncomplicated: Secondary | ICD-10-CM | POA: Insufficient documentation

## 2016-08-30 DIAGNOSIS — Z79899 Other long term (current) drug therapy: Secondary | ICD-10-CM | POA: Insufficient documentation

## 2016-08-30 DIAGNOSIS — L0201 Cutaneous abscess of face: Secondary | ICD-10-CM | POA: Insufficient documentation

## 2016-08-30 HISTORY — DX: Depression, unspecified: F32.A

## 2016-08-30 HISTORY — DX: Major depressive disorder, single episode, unspecified: F32.9

## 2016-08-30 HISTORY — DX: Bipolar disorder, unspecified: F31.9

## 2016-08-30 MED ORDER — ACETAMINOPHEN 500 MG PO TABS
1000.0000 mg | ORAL_TABLET | Freq: Once | ORAL | Status: AC
  Administered 2016-08-30: 1000 mg via ORAL
  Filled 2016-08-30: qty 2

## 2016-08-30 MED ORDER — DOXYCYCLINE HYCLATE 100 MG PO TABS
100.0000 mg | ORAL_TABLET | Freq: Once | ORAL | Status: AC
  Administered 2016-08-30: 100 mg via ORAL
  Filled 2016-08-30: qty 1

## 2016-08-30 MED ORDER — DEXTROSE 5 % IV SOLN
1.0000 g | Freq: Once | INTRAVENOUS | Status: AC
  Administered 2016-08-30: 1 g via INTRAVENOUS
  Filled 2016-08-30: qty 10

## 2016-08-30 MED ORDER — IBUPROFEN 800 MG PO TABS
800.0000 mg | ORAL_TABLET | Freq: Once | ORAL | Status: AC
  Administered 2016-08-30: 800 mg via ORAL
  Filled 2016-08-30: qty 1

## 2016-08-30 MED ORDER — PROCHLORPERAZINE EDISYLATE 5 MG/ML IJ SOLN
10.0000 mg | Freq: Once | INTRAMUSCULAR | Status: AC
  Administered 2016-08-30: 10 mg via INTRAVENOUS
  Filled 2016-08-30: qty 2

## 2016-08-30 MED ORDER — DOXYCYCLINE HYCLATE 100 MG PO CAPS: 100.0000 mg | ORAL_CAPSULE | Freq: Two times a day (BID) | ORAL | 0 refills | Status: DC

## 2016-08-30 MED ORDER — MORPHINE SULFATE (PF) 4 MG/ML IV SOLN
4.0000 mg | Freq: Once | INTRAVENOUS | Status: AC
Start: 2016-08-30 — End: 2016-08-30
  Administered 2016-08-30: 4 mg via INTRAVENOUS
  Filled 2016-08-30: qty 1

## 2016-08-30 MED ORDER — IBUPROFEN 800 MG PO TABS: 800.0000 mg | ORAL_TABLET | Freq: Three times a day (TID) | ORAL | 0 refills | Status: DC

## 2016-08-30 MED ORDER — IOPAMIDOL (ISOVUE-300) INJECTION 61%
75.0000 mL | Freq: Once | INTRAVENOUS | Status: AC | PRN
  Administered 2016-08-30: 75 mL via INTRAVENOUS

## 2016-08-30 MED ORDER — HYDROCODONE-ACETAMINOPHEN 5-325 MG PO TABS: 1.0000 | ORAL_TABLET | ORAL | 0 refills | Status: AC | PRN

## 2016-08-30 NOTE — ED Triage Notes (Signed)
Knot on rt side of face x 2 weeks.  Pt has tried popping it.  Has gotten worse in past two days after pt shaved.

## 2016-08-30 NOTE — ED Provider Notes (Signed)
Medical screening examination/treatment/procedure(s) were conducted as a shared visit with non-physician practitioner(s) and myself.  I personally evaluated the patient during the encounter.   EKG Interpretation None      Patient seen by me along with the physician assistant. Patient with a skin abscess to the right angle of the jaw. Area of induration is about 5 cm. No significant fluctuance at this time. Old deep poor in the area sounds as if there may have been a previous infection there. Patient's CT scan reviewed  The dysuria is a 11 mm abscess in the area. Since not pointing at the surface and since is on the face will give a trial of antibiotics and warm compresses. Do not recommend I&D of this at this time. Referral information the ear nose and throat can be provided if he gets worse. With the lace patient on doxycycline for 7 days.    No results found for this or any previous visit. Ct Soft Tissue Neck W Contrast  Result Date: 08/30/2016 CLINICAL DATA:  Facial abscess.  Difficulty swallowing. EXAM: CT NECK WITH CONTRAST TECHNIQUE: Multidetector CT imaging of the neck was performed using the standard protocol following the bolus administration of intravenous contrast. CONTRAST:  75mL ISOVUE-300 IOPAMIDOL (ISOVUE-300) INJECTION 61% COMPARISON:  None. FINDINGS: Pharynx and larynx: No mass or swelling. Salivary glands: No primary inflammation or stone. Thyroid: Negative Lymph nodes: Mild reactive enlargement of right cervical lymph nodes. Vascular: Negative.  No venous occlusion. Limited intracranial: Negative Visualized orbits: Negative Mastoids and visualized paranasal sinuses: Clear Skeleton: C5-6 disc narrowing and endplate/uncovertebral ridging. Dental caries with bilateral molar periapical erosions. Upper chest: Clear apical lungs. Other: Superficial abscess in the right cheek measuring up to 11 mm in diameter. There is extensive surrounding cellulitic changes with right platysma  thickening and secondary inflammation in the superficial right parotid and masseter. No soft tissue emphysema. IMPRESSION: 11 mm right cheek abscess with cellulitis. No airway swelling/narrowing. Electronically Signed   By: Marnee SpringJonathon  Watts M.D.   On: 08/30/2016 16:14      Vanetta MuldersScott Tyrrell Stephens, MD 08/30/16 1650

## 2016-08-30 NOTE — Discharge Instructions (Signed)
Please use doxycycline 2 times daily with food until all taken. Apply warm compresses to your face 3 times daily. Use ibuprofen 3 times daily with food. Use Norco for pain if needed. Please see Dr.Teoh for incision and drainage if this is not improving in the next 5-7 days.

## 2016-08-30 NOTE — ED Provider Notes (Signed)
[facility]-EMERGENCY DEPT Provider Note   CSN: [id number] Arrival date & time: 08/30/16  1020     History   Chief Complaint Chief Complaint  Patient presents with  . Abscess    HPI Daniel Rose is a 30 y.o. male.  Patient is a 30 year old male who presents to the emergency department because of an abscess to the right face.  The patient states that he's been dealing with this abscess for nearly 2 weeks. It started as a small pimple. He states he picked at it, and tried to squeeze it, nothing came out, but it got a little larger. He continued to try to squeeze it and he says he "picked at it", and now he has swelling of the right side of the face. He came to the emergency department because he said he felt as though it was more difficult to swallow than usual. He is not had any difficulty with his breathing. He has pain to the right side of his face from his ear down to his neck area. He is able to chew and speak. Palpation in certain movements of his mouth make the pain worse. Nothing really seems to make the problem any better.    Abscess  Associated symptoms: no fever     Past Medical History:  Diagnosis Date  . Bipolar 1 disorder (HCC)   . Depression   . Substance abuse     There are no active problems to display for this patient.   Past Surgical History:  Procedure Laterality Date  . ANKLE SURGERY    . HAND SURGERY         Home Medications    Prior to Admission medications   Medication Sig Start Date End Date Taking? Authorizing Provider  citalopram (CELEXA) 20 MG tablet Take 20 mg by mouth daily.   Yes Historical Provider, MD  doxepin (SINEQUAN) 10 MG capsule Take 10 mg by mouth at bedtime.   Yes Historical Provider, MD  clotrimazole (LOTRIMIN) 1 % cream Apply to affected area 2 times daily Patient not taking: Reported on 08/30/2016 07/24/16   Tammy Triplett, PA-C  diclofenac (VOLTAREN) 75 MG EC tablet Take 1 tablet (75 mg total) by mouth 2 (two) times daily.  Take with food Patient not taking: Reported on 08/30/2016 07/24/16   Pauline Aus, PA-C    Family History History reviewed. No pertinent family history.  Social History Social History  Substance Use Topics  . Smoking status: Current Some Day Smoker    Packs/day: 0.50    Types: Cigarettes  . Smokeless tobacco: Never Used  . Alcohol use Yes     Comment: occassionally     Allergies   Review of patient's allergies indicates no known allergies.   Review of Systems Review of Systems  Constitutional: Negative for chills and fever.  HENT: Positive for facial swelling.   Skin: Positive for wound.  All other systems reviewed and are negative.    Physical Exam Updated Vital Signs BP 160/90 (BP Location: Left Arm)   Pulse 72   Temp 98 F (36.7 C) (Oral)   Ht 5\' 9"  (1.753 m)   Wt 82.6 kg   SpO2 100%   BMI 26.88 kg/m   Physical Exam  Constitutional: Vital signs are normal. He appears well-developed and well-nourished. He is active.  HENT:  Head: Normocephalic and atraumatic.  Right Ear: Tympanic membrane, external ear and ear canal normal.  Left Ear: Tympanic membrane, external ear and ear canal normal.  Nose:  Nose normal.  Mouth/Throat: Uvula is midline, oropharynx is clear and moist and mucous membranes are normal.  There is swelling and tenderness of the right face. Air is a palpable abscess on present. There are submental area nodes appreciated.  There is no swelling of the tongue. The airway is patent. There is no swelling under the tongue.  Eyes: Conjunctivae, EOM and lids are normal. Pupils are equal, round, and reactive to light.  Neck: Trachea normal, normal range of motion and phonation normal. Neck supple. Carotid bruit is not present.  Cardiovascular: Normal rate, regular rhythm and normal pulses.   Abdominal: Soft. Normal appearance and bowel sounds are normal.  Lymphadenopathy:       Head (right side): No submental, no preauricular and no posterior auricular  adenopathy present.       Head (left side): No submental, no preauricular and no posterior auricular adenopathy present.    He has no cervical adenopathy.  Neurological: He is alert. He has normal strength. No cranial nerve deficit or sensory deficit. GCS eye subscore is 4. GCS verbal subscore is 5. GCS motor subscore is 6.  Skin: Skin is warm and dry.  Psychiatric: His speech is normal.     ED Treatments / Results  Labs (all labs ordered are listed, but only abnormal results are displayed) Labs Reviewed - No data to display  EKG  EKG Interpretation None       Radiology Ct Soft Tissue Neck W Contrast  Result Date: 08/30/2016 CLINICAL DATA:  Facial abscess.  Difficulty swallowing. EXAM: CT NECK WITH CONTRAST TECHNIQUE: Multidetector CT imaging of the neck was performed using the standard protocol following the bolus administration of intravenous contrast. CONTRAST:  75mL ISOVUE-300 IOPAMIDOL (ISOVUE-300) INJECTION 61% COMPARISON:  None. FINDINGS: Pharynx and larynx: No mass or swelling. Salivary glands: No primary inflammation or stone. Thyroid: Negative Lymph nodes: Mild reactive enlargement of right cervical lymph nodes. Vascular: Negative.  No venous occlusion. Limited intracranial: Negative Visualized orbits: Negative Mastoids and visualized paranasal sinuses: Clear Skeleton: C5-6 disc narrowing and endplate/uncovertebral ridging. Dental caries with bilateral molar periapical erosions. Upper chest: Clear apical lungs. Other: Superficial abscess in the right cheek measuring up to 11 mm in diameter. There is extensive surrounding cellulitic changes with right platysma thickening and secondary inflammation in the superficial right parotid and masseter. No soft tissue emphysema. IMPRESSION: 11 mm right cheek abscess with cellulitis. No airway swelling/narrowing. Electronically Signed   By: Marnee Spring M.D.   On: 08/30/2016 16:14    Procedures Procedures (including critical care  time)  Medications Ordered in ED Medications  cefTRIAXone (ROCEPHIN) 1 g in dextrose 5 % 50 mL IVPB (not administered)  doxycycline (VIBRA-TABS) tablet 100 mg (not administered)  morphine 4 MG/ML injection 4 mg (not administered)  prochlorperazine (COMPAZINE) injection 10 mg (not administered)  ibuprofen (ADVIL,MOTRIN) tablet 800 mg (800 mg Oral Given 08/30/16 1407)  acetaminophen (TYLENOL) tablet 1,000 mg (1,000 mg Oral Given 08/30/16 1407)  iopamidol (ISOVUE-300) 61 % injection 75 mL (75 mLs Intravenous Contrast Given 08/30/16 1553)     Initial Impression / Assessment and Plan / ED Course  I have reviewed the triage vital signs and the nursing notes.  Pertinent labs & imaging results that were available during my care of the patient were reviewed by me and considered in my medical decision making (see chart for details).  Clinical Course    *I have reviewed nursing notes, vital signs, and all appropriate lab and imaging results for this patient.**  Final Clinical Impressions(s) / ED Diagnoses  Vital signs reviewed. CT scan reveals an 11 mm right cheek abscess with cellulitis present. There is no airway swelling, and no airway narrowing appreciated.  Patient seen with me by Dr. Deretha EmoryZackowski . Patient will be treated with intravenous Rocephin and oral doxycycline. Prescription for oral doxycycline and medication for pain given to the patient. The patient will apply warm compresses to the face, and he will see your nose and throat for evaluation if not improving.    Final diagnoses:  None    New Prescriptions New Prescriptions   No medications on file     Ivery QualeHobson Naliyah Neth, PA-C 08/30/16 1705    Vanetta MuldersScott Zackowski, MD 08/31/16 [phone]

## 2017-07-01 ENCOUNTER — Encounter (HOSPITAL_COMMUNITY): Payer: Self-pay | Admitting: Emergency Medicine

## 2017-07-01 ENCOUNTER — Emergency Department (HOSPITAL_COMMUNITY)
Admission: EM | Admit: 2017-07-01 | Discharge: 2017-07-01 | Disposition: A | Discharge status: Home / Self Care | Payer: Self-pay | Attending: Emergency Medicine | Admitting: Emergency Medicine

## 2017-07-01 DIAGNOSIS — F17210 Nicotine dependence, cigarettes, uncomplicated: Secondary | ICD-10-CM | POA: Insufficient documentation

## 2017-07-01 DIAGNOSIS — L0201 Cutaneous abscess of face: Secondary | ICD-10-CM | POA: Insufficient documentation

## 2017-07-01 DIAGNOSIS — F319 Bipolar disorder, unspecified: Secondary | ICD-10-CM | POA: Insufficient documentation

## 2017-07-01 DIAGNOSIS — Z79899 Other long term (current) drug therapy: Secondary | ICD-10-CM | POA: Insufficient documentation

## 2017-07-01 MED ORDER — LIDOCAINE-EPINEPHRINE (PF) 2 %-1:200000 IJ SOLN
10.0000 mL | Freq: Once | INTRAMUSCULAR | Status: AC
  Administered 2017-07-01: 10:00:00 via INTRADERMAL
  Filled 2017-07-01: qty 20

## 2017-07-01 MED ORDER — IBUPROFEN 600 MG PO TABS: 600.0000 mg | ORAL_TABLET | Freq: Four times a day (QID) | ORAL | 0 refills | Status: AC | PRN

## 2017-07-01 MED ORDER — DOXYCYCLINE HYCLATE 100 MG PO CAPS: 100.0000 mg | ORAL_CAPSULE | Freq: Two times a day (BID) | ORAL | 0 refills | Status: AC

## 2017-07-01 NOTE — Discharge Instructions (Signed)
Warm compresses to the face several times a day. Doxycycline as prescribed until all gone. Ibuprofen or tylenol for pain. Follow up with family doctor as needed. Return if worsening.

## 2017-07-01 NOTE — ED Provider Notes (Signed)
[facility]-EMERGENCY DEPT Provider Note   By signing my name below, I, Earmon Phoenix, attest that this documentation has been prepared under the direction and in the presence of Elanna Bert, PA-C. Electronically Signed: Earmon Phoenix, ED Scribe. 07/01/17. 10:18 AM.    History   Chief Complaint Chief Complaint  Patient presents with  . Abscess   The history is provided by the patient and medical records. No language interpreter was used.    Daniel Rose is a 31 y.o. male who presents to the Emergency Department complaining of an abscess to the right side of his face that began 4-5 days ago. He reports associated swelling and pain. He reports recurrent dental abscesses for the past several months. He has not taken anything for pain but has been taking hot showers letting the water run over the area. Touching the area increases the pain. He denies alleviating factors. He denies fever, chills, nausea, vomiting, drainage or red streaking around the area.   Past Medical History:  Diagnosis Date  . Bipolar 1 disorder (HCC)   . Depression   . Substance abuse     There are no active problems to display for this patient.   Past Surgical History:  Procedure Laterality Date  . ANKLE SURGERY    . HAND SURGERY         Home Medications    Prior to Admission medications   Medication Sig Start Date End Date Taking? Authorizing Provider  citalopram (CELEXA) 20 MG tablet Take 20 mg by mouth daily.    [provider]  clotrimazole (LOTRIMIN) 1 % cream Apply to affected area 2 times daily Patient not taking: Reported on 08/30/2016 07/24/16   Pauline Aus, PA-C  diclofenac (VOLTAREN) 75 MG EC tablet Take 1 tablet (75 mg total) by mouth 2 (two) times daily. Take with food Patient not taking: Reported on 08/30/2016 07/24/16   Triplett, Tammy, PA-C  doxepin (SINEQUAN) 10 MG capsule Take 10 mg by mouth at bedtime.    [provider]  doxycycline (VIBRAMYCIN) 100 MG  capsule Take 1 capsule (100 mg total) by mouth 2 (two) times daily. 08/30/16   Ivery Quale, PA-C  HYDROcodone-acetaminophen (NORCO/VICODIN) 5-325 MG tablet Take 1 tablet by mouth every 4 (four) hours as needed. 08/30/16   Ivery Quale, PA-C  ibuprofen (ADVIL,MOTRIN) 800 MG tablet Take 1 tablet (800 mg total) by mouth 3 (three) times daily. 08/30/16   Ivery Quale, PA-C    Family History No family history on file.  Social History Social History  Substance Use Topics  . Smoking status: Current Some Day Smoker    Packs/day: 0.50    Types: Cigarettes  . Smokeless tobacco: Never Used  . Alcohol use No     Comment: occassionally     Allergies   Patient has no known allergies.   Review of Systems Review of Systems  Constitutional: Negative for chills and fever.  Gastrointestinal: Negative for nausea and vomiting.  Skin: Positive for color change.       Abscess to right side of face     Physical Exam Updated Vital Signs BP (!) 144/91   Pulse 92   Temp 98.7 F (37.1 C) (Oral)   Ht 5\' 8"  (1.727 m)   Wt 210 lb (95.3 kg)   SpO2 94%   BMI 31.93 kg/m   Physical Exam  Constitutional: He is oriented to person, place, and time. He appears well-developed and well-nourished.  HENT:  Head: Normocephalic and atraumatic.  Neck:  Normal range of motion.  Cardiovascular: Normal rate.   Pulmonary/Chest: Effort normal.  Musculoskeletal: Normal range of motion.  Neurological: He is alert and oriented to person, place, and time.  Skin: Skin is warm and dry.  2x2cm abscess to the right lower cheek, area is indurated, erythematous, fluctuant, tender to the touch  Psychiatric: He has a normal mood and affect. His behavior is normal.  Nursing note and vitals reviewed.    ED Treatments / Results  DIAGNOSTIC STUDIES: Oxygen Saturation is 94% on RA, adequate by my interpretation.   COORDINATION OF CARE: 9:55 AM- Will incise and drain abscess. Pt verbalizes understanding and agrees  to plan.  Medications  lidocaine-EPINEPHrine (XYLOCAINE W/EPI) 2 %-1:200000 (PF) injection 10 mL ( Intradermal Given 07/01/17 1001)    Labs (all labs ordered are listed, but only abnormal results are displayed) Labs Reviewed - No data to display  EKG  EKG Interpretation None       Radiology No results found.  Procedures .Marland Kitchen.Incision and Drainage Date/Time: 07/01/2017 10:12 AM Performed by: Jaynie CrumbleKIRICHENKO, Nikiah Goin Authorized by: Jaynie CrumbleKIRICHENKO, Leeandre Nordling   Consent:    Consent obtained:  Verbal   Consent given by:  Patient Location:    Type:  Abscess   Size:  2 cm   Location:  Head   Head location:  Face Pre-procedure details:    Skin preparation:  Betadine Anesthesia (see MAR for exact dosages):    Anesthesia method:  Local infiltration   Local anesthetic:  Lidocaine 2% WITH epi Procedure type:    Complexity:  Complex Procedure details:    Needle aspiration: no     Incision types:  Stab incision   Incision depth:  Subcutaneous   Scalpel blade:  11   Wound management:  Probed and deloculated   Drainage:  Purulent   Drainage amount:  Copious   Wound treatment:  Wound left open   Packing materials:  None Post-procedure details:    Patient tolerance of procedure:  Tolerated well, no immediate complications    (including critical care time)  Medications Ordered in ED Medications  lidocaine-EPINEPHrine (XYLOCAINE W/EPI) 2 %-1:200000 (PF) injection 10 mL ( Intradermal Given 07/01/17 1001)     Initial Impression / Assessment and Plan / ED Course  I have reviewed the triage vital signs and the nursing notes.  Pertinent labs & imaging results that were available during my care of the patient were reviewed by me and considered in my medical decision making (see chart for details).     Patient with skin abscess to right lower cheek that appeared 5 days ago. Incision and drainage performed in the ED today. Abscess was not large enough to warrant packing or drain placement.  Wound recheck in 2 days. Supportive care and return precautions discussed. Pt sent home with Doxycycline. The patient appears reasonably screened and/or stabilized for discharge and I doubt any other emergent medical condition requiring further screening, evaluation, or treatment in the ED prior to discharge.  Vitals:   07/01/17 0929 07/01/17 0934 07/01/17 1000  BP: (!) 144/91    Pulse: 92    Temp:   98.7 F (37.1 C)  TempSrc:   Oral  SpO2: 94%    Weight:  95.3 kg (210 lb)   Height:  5\' 8"  (1.727 m)      Final Clinical Impressions(s) / ED Diagnoses   Final diagnoses:  Abscess of face    New Prescriptions New Prescriptions   No medications on file   I personally performed  the services described in this documentation, which was scribed in my presence. The recorded information has been reviewed and is accurate.      Jaynie Crumble, PA-C 07/01/17 1403    Melene Plan, DO 07/01/17 1435

## 2017-07-01 NOTE — ED Triage Notes (Signed)
Abscess right cheek. Hx of infection there 10 months ago requiring IV abx.

## 2017-07-07 IMAGING — CT CT NECK W/ CM
4 series · 14 of 33 positions shown, 17 images · IV contrast (iopamidol)
Comparison: None.

CLINICAL DATA: Facial abscess.  Difficulty swallowing.

EXAM:
CT NECK WITH CONTRAST
TECHNIQUE: Multidetector CT imaging of the neck was performed using the
standard protocol following the bolus administration of intravenous
contrast.
CONTRAST:  75mL [id number]-[id number] IOPAMIDOL ([id number]-[id number]) INJECTION 61%

[Series 2: axial neck · axial · 0.44mm/px · z∈[+1205,+1357]mm · 5 of 114 slices shown, 7 images]
[im 19/114  soft-tissue]
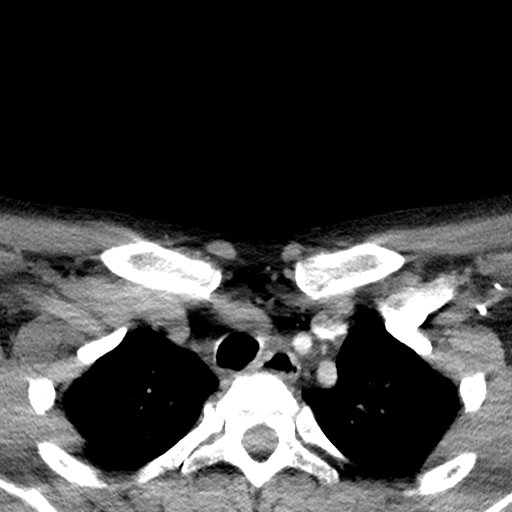
[im 19/114  bone]
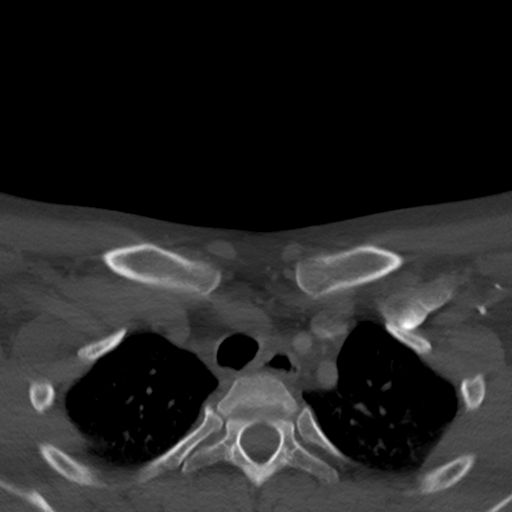
[im 38/114  bone]
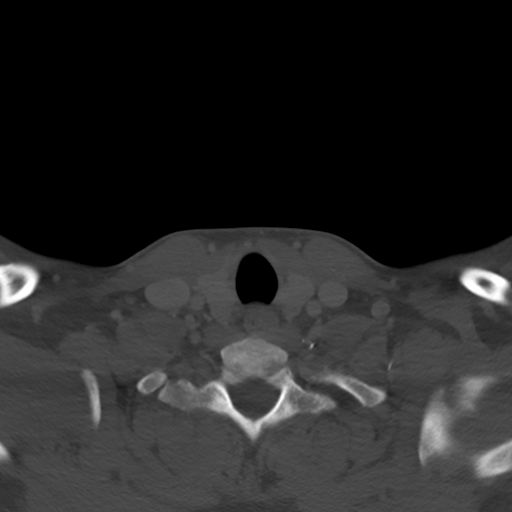
[im 57/114  bone]
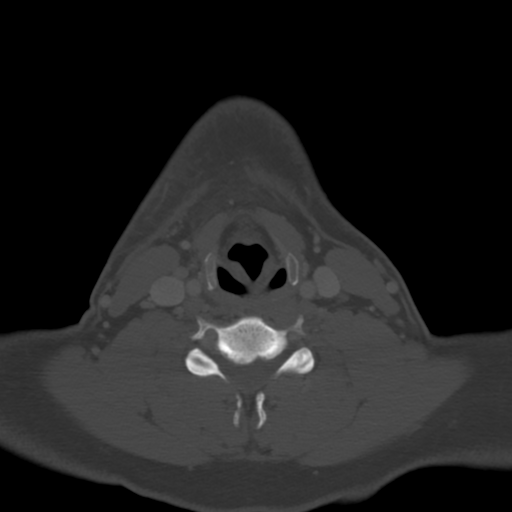
[im 76/114  bone]
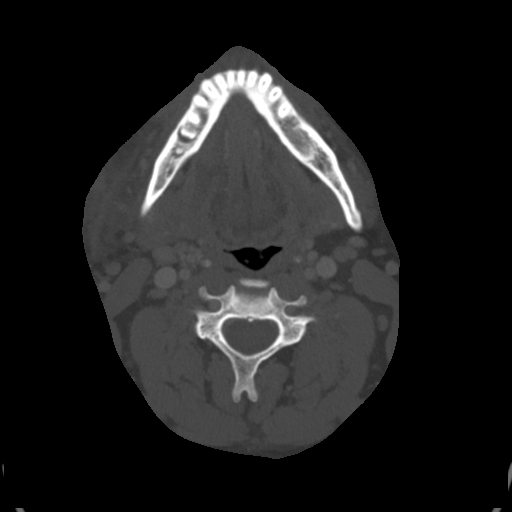
[im 95/114  soft-tissue]
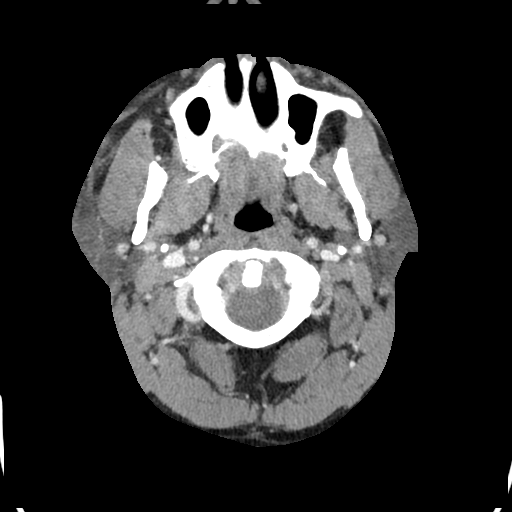
[im 95/114  bone]
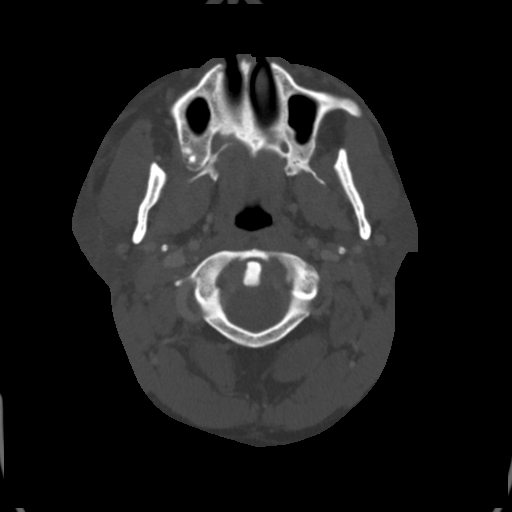

[Series 4: sag neck · sagittal · 0.44mm/px · 5 of 101 slices shown, 6 images]
[im 34/101  bone]
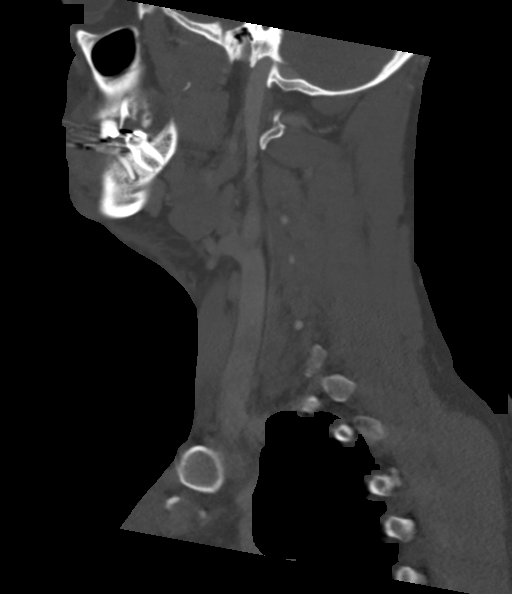
[im 42/101  bone]
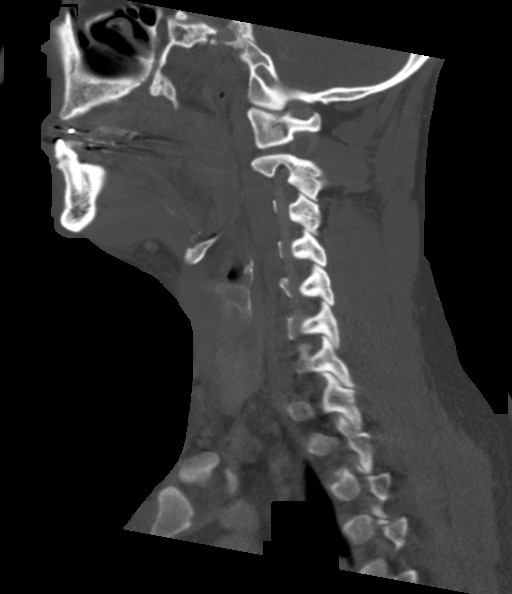
[im 51/101  soft-tissue]
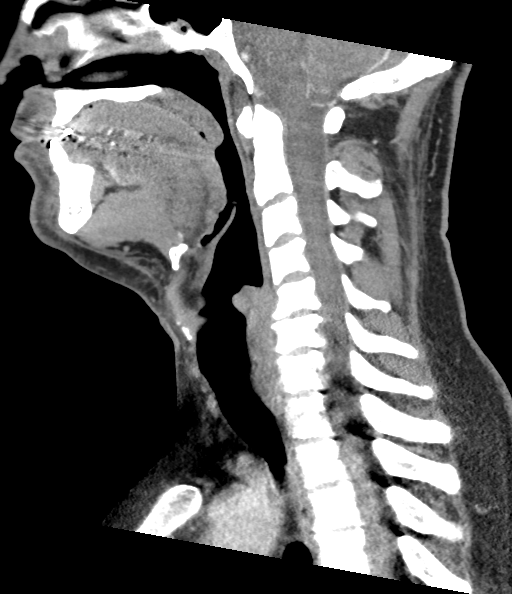
[im 51/101  bone]
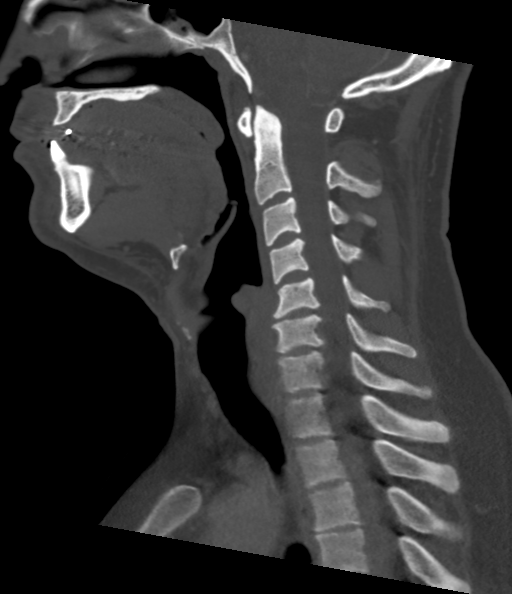
[im 59/101  bone]
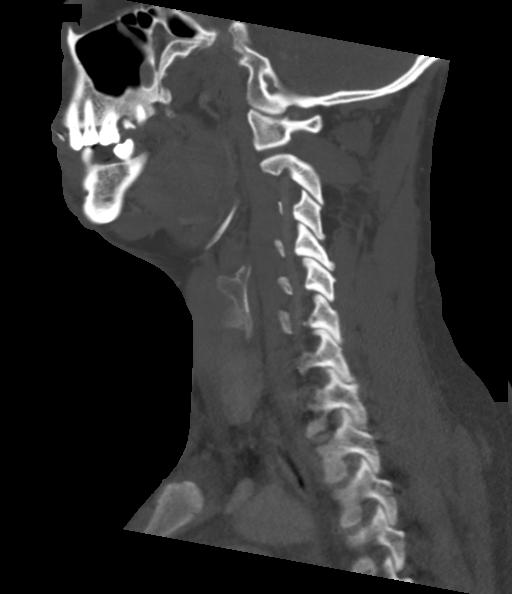
[im 67/101  bone]
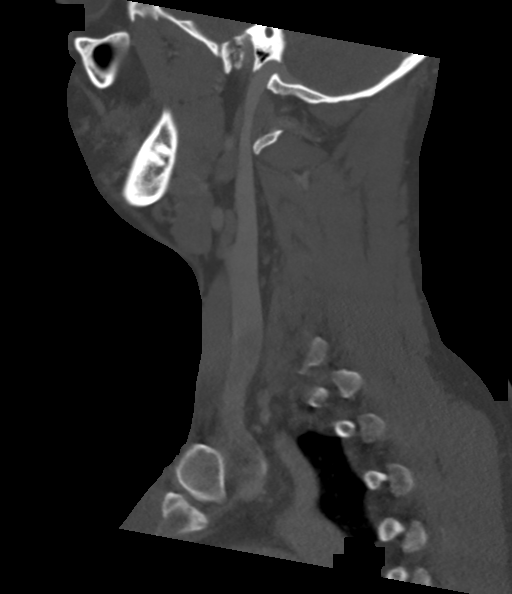

[Series 5: cor neck · coronal · 0.42mm/px · 3 of 118 slices shown]
[im 24/118  bone]
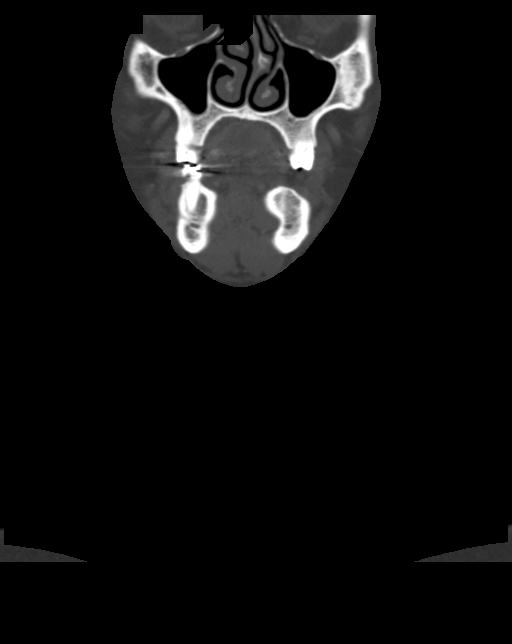
[im 47/118  bone]
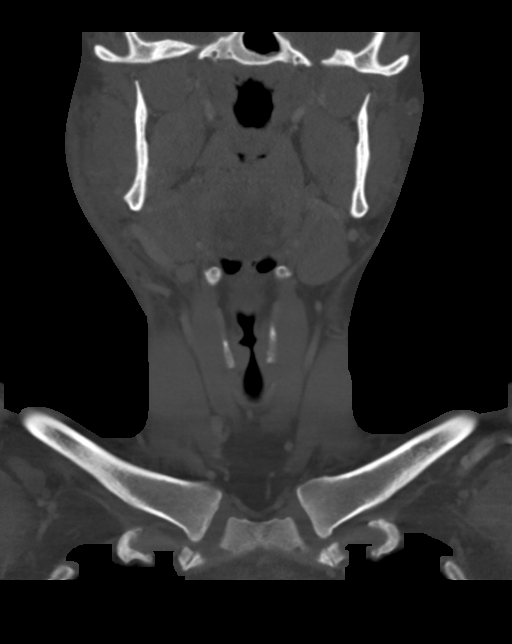
[im 71/118  bone]
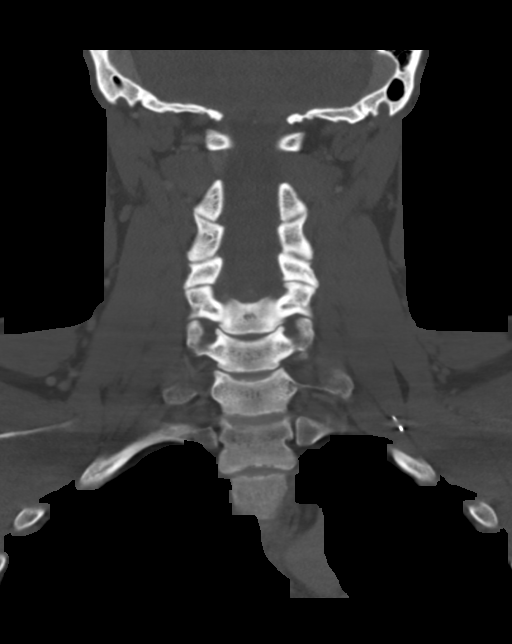

[Series 6: orthogonal ax · axial · 0.39mm/px · 1 of 116 slices shown]
[im 20/116  bone]
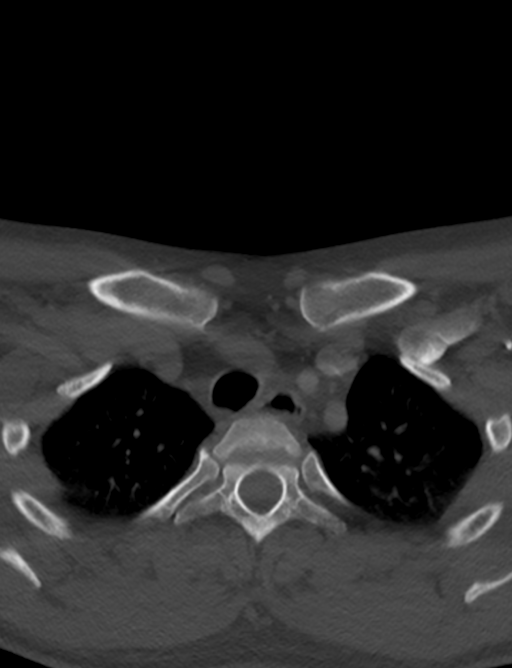

[14 of 33 positions shown; findings below may reference images not displayed]

FINDINGS: Pharynx and larynx: No mass or swelling.

Salivary glands: No primary inflammation or stone.

Thyroid: Negative

Lymph nodes: Mild reactive enlargement of right cervical lymph
nodes.

Vascular: Negative.  No venous occlusion.

Limited intracranial: Negative

Visualized orbits: Negative

Mastoids and visualized paranasal sinuses: Clear

Skeleton: C5-6 disc narrowing and endplate/uncovertebral ridging.

Dental caries with bilateral molar periapical erosions.

Upper chest: Clear apical lungs.

Other: Superficial abscess in the right cheek measuring up to 11 mm
in diameter. There is extensive surrounding cellulitic changes with
right platysma thickening and secondary inflammation in the
superficial right parotid and masseter. No soft tissue emphysema.
IMPRESSION: 11 mm right cheek abscess with cellulitis. No airway
swelling/narrowing.

## 2018-10-22 DEATH — deceased
